# Patient Record
Sex: Female | Born: 1970 | Hispanic: No | Marital: Married | Smoking: Never smoker
Health system: Southern US, Community
[De-identification: ages and names within clinical notes are randomized; demographics above are authoritative.]

## PROBLEM LIST (undated history)

## (undated) DIAGNOSIS — I10 Essential (primary) hypertension: Secondary | ICD-10-CM

---

## 2004-11-17 ENCOUNTER — Other Ambulatory Visit: Admission: RE | Admit: 2004-11-17 | Discharge: 2004-11-17 | Payer: Self-pay | Admitting: Family Medicine

## 2011-07-20 ENCOUNTER — Other Ambulatory Visit (HOSPITAL_COMMUNITY): Payer: Self-pay | Admitting: Family Medicine

## 2011-07-20 DIAGNOSIS — Z139 Encounter for screening, unspecified: Secondary | ICD-10-CM

## 2011-07-25 ENCOUNTER — Encounter (HOSPITAL_COMMUNITY): Payer: Self-pay

## 2011-08-15 ENCOUNTER — Ambulatory Visit (HOSPITAL_COMMUNITY)
Admission: RE | Admit: 2011-08-15 | Discharge: 2011-08-15 | Disposition: A | Discharge status: Home / Self Care | Payer: Self-pay | Source: Ambulatory Visit | Attending: Family Medicine | Admitting: Family Medicine

## 2011-08-15 DIAGNOSIS — Z139 Encounter for screening, unspecified: Secondary | ICD-10-CM

## 2012-09-11 ENCOUNTER — Telehealth: Payer: Self-pay | Admitting: Nurse Practitioner

## 2012-09-11 NOTE — Telephone Encounter (Signed)
NOTIFIED NOT TAKING NEW PATIENTS. WILL NEED TO GO TO ER OR EMERGENCY ROOM.

## 2012-10-11 ENCOUNTER — Other Ambulatory Visit (HOSPITAL_COMMUNITY): Payer: Self-pay | Admitting: *Deleted

## 2012-10-11 DIAGNOSIS — Z139 Encounter for screening, unspecified: Secondary | ICD-10-CM

## 2012-10-15 ENCOUNTER — Ambulatory Visit (HOSPITAL_COMMUNITY)
Admission: RE | Admit: 2012-10-15 | Discharge: 2012-10-15 | Disposition: A | Discharge status: Home / Self Care | Payer: 59 | Source: Ambulatory Visit | Attending: *Deleted | Admitting: *Deleted

## 2012-10-15 DIAGNOSIS — Z1231 Encounter for screening mammogram for malignant neoplasm of breast: Secondary | ICD-10-CM | POA: Insufficient documentation

## 2012-10-15 DIAGNOSIS — Z139 Encounter for screening, unspecified: Secondary | ICD-10-CM

## 2013-06-25 ENCOUNTER — Ambulatory Visit (INDEPENDENT_AMBULATORY_CARE_PROVIDER_SITE_OTHER): Payer: 59 | Admitting: Family Medicine

## 2013-06-25 ENCOUNTER — Emergency Department (HOSPITAL_COMMUNITY)
Admission: EM | Admit: 2013-06-25 | Discharge: 2013-06-25 | Disposition: A | Discharge status: Home / Self Care | Payer: 59 | Attending: Emergency Medicine | Admitting: Emergency Medicine

## 2013-06-25 ENCOUNTER — Emergency Department (HOSPITAL_COMMUNITY): Payer: 59

## 2013-06-25 ENCOUNTER — Encounter (HOSPITAL_COMMUNITY): Payer: Self-pay | Admitting: Emergency Medicine

## 2013-06-25 ENCOUNTER — Encounter (INDEPENDENT_AMBULATORY_CARE_PROVIDER_SITE_OTHER): Payer: Self-pay

## 2013-06-25 VITALS — BP 138/84 | HR 99 | Temp 97.8°F | Wt 197.0 lb

## 2013-06-25 DIAGNOSIS — R197 Diarrhea, unspecified: Secondary | ICD-10-CM | POA: Insufficient documentation

## 2013-06-25 DIAGNOSIS — K429 Umbilical hernia without obstruction or gangrene: Secondary | ICD-10-CM | POA: Insufficient documentation

## 2013-06-25 DIAGNOSIS — I10 Essential (primary) hypertension: Secondary | ICD-10-CM | POA: Insufficient documentation

## 2013-06-25 DIAGNOSIS — G8929 Other chronic pain: Secondary | ICD-10-CM

## 2013-06-25 DIAGNOSIS — R059 Cough, unspecified: Secondary | ICD-10-CM | POA: Insufficient documentation

## 2013-06-25 DIAGNOSIS — M79609 Pain in unspecified limb: Secondary | ICD-10-CM | POA: Insufficient documentation

## 2013-06-25 DIAGNOSIS — R05 Cough: Secondary | ICD-10-CM | POA: Insufficient documentation

## 2013-06-25 DIAGNOSIS — R319 Hematuria, unspecified: Secondary | ICD-10-CM | POA: Insufficient documentation

## 2013-06-25 DIAGNOSIS — R1031 Right lower quadrant pain: Secondary | ICD-10-CM

## 2013-06-25 DIAGNOSIS — Z3202 Encounter for pregnancy test, result negative: Secondary | ICD-10-CM | POA: Insufficient documentation

## 2013-06-25 DIAGNOSIS — R079 Chest pain, unspecified: Secondary | ICD-10-CM | POA: Insufficient documentation

## 2013-06-25 DIAGNOSIS — R109 Unspecified abdominal pain: Secondary | ICD-10-CM | POA: Insufficient documentation

## 2013-06-25 DIAGNOSIS — R112 Nausea with vomiting, unspecified: Secondary | ICD-10-CM

## 2013-06-25 DIAGNOSIS — R0602 Shortness of breath: Secondary | ICD-10-CM | POA: Insufficient documentation

## 2013-06-25 HISTORY — DX: Essential (primary) hypertension: I10

## 2013-06-25 LAB — CBC WITH DIFFERENTIAL/PLATELET
Basophils Absolute: 0 10*3/uL (ref 0.0–0.1)
Basophils Relative: 0 % (ref 0–1)
Eosinophils Absolute: 0.2 10*3/uL (ref 0.0–0.7)
Eosinophils Relative: 2 % (ref 0–5)
HEMATOCRIT: 37.7 % (ref 36.0–46.0)
HEMOGLOBIN: 13.1 g/dL (ref 12.0–15.0)
Lymphocytes Relative: 17 % (ref 12–46)
Lymphs Abs: 2 10*3/uL (ref 0.7–4.0)
MCH: 30 pg (ref 26.0–34.0)
MCHC: 34.7 g/dL (ref 30.0–36.0)
MCV: 86.5 fL (ref 78.0–100.0)
MONO ABS: 0.9 10*3/uL (ref 0.1–1.0)
Monocytes Relative: 8 % (ref 3–12)
Neutro Abs: 8.5 10*3/uL — ABNORMAL HIGH (ref 1.7–7.7)
Neutrophils Relative %: 73 % (ref 43–77)
Platelets: 309 10*3/uL (ref 150–400)
RBC: 4.36 MIL/uL (ref 3.87–5.11)
RDW: 12.8 % (ref 11.5–15.5)
WBC: 11.7 10*3/uL — AB (ref 4.0–10.5)

## 2013-06-25 LAB — URINALYSIS, ROUTINE W REFLEX MICROSCOPIC
BILIRUBIN URINE: NEGATIVE
Glucose, UA: NEGATIVE mg/dL
KETONES UR: NEGATIVE mg/dL
Leukocytes, UA: NEGATIVE
Nitrite: NEGATIVE
Protein, ur: NEGATIVE mg/dL
Specific Gravity, Urine: 1.01 (ref 1.005–1.030)
UROBILINOGEN UA: 0.2 mg/dL (ref 0.0–1.0)
pH: 5.5 (ref 5.0–8.0)

## 2013-06-25 LAB — COMPREHENSIVE METABOLIC PANEL
ALK PHOS: 74 U/L (ref 39–117)
ALT: 18 U/L (ref 0–35)
AST: 13 U/L (ref 0–37)
Albumin: 3.1 g/dL — ABNORMAL LOW (ref 3.5–5.2)
BUN: 8 mg/dL (ref 6–23)
CO2: 21 meq/L (ref 19–32)
CREATININE: 0.49 mg/dL — AB (ref 0.50–1.10)
Calcium: 8 mg/dL — ABNORMAL LOW (ref 8.4–10.5)
Chloride: 104 mEq/L (ref 96–112)
GFR calc Af Amer: >90 mL/min (ref >90)
Glucose, Bld: 96 mg/dL (ref 70–99)
Potassium: 3.4 mEq/L — ABNORMAL LOW (ref 3.7–5.3)
SODIUM: 138 meq/L (ref 137–147)
Total Protein: 7.2 g/dL (ref 6.0–8.3)

## 2013-06-25 LAB — URINE MICROSCOPIC-ADD ON

## 2013-06-25 LAB — PREGNANCY, URINE: PREG TEST UR: NEGATIVE

## 2013-06-25 LAB — TROPONIN I: Troponin I: <0.3 ng/mL (ref <0.30)

## 2013-06-25 MED ORDER — SODIUM CHLORIDE 0.9 % IV BOLUS (SEPSIS)
500.0000 mL | Freq: Once | INTRAVENOUS | Status: AC
  Administered 2013-06-25: 500 mL via INTRAVENOUS

## 2013-06-25 MED ORDER — ONDANSETRON HCL 4 MG/2ML IJ SOLN
4.0000 mg | Freq: Once | INTRAMUSCULAR | Status: AC
  Administered 2013-06-25: 4 mg via INTRAVENOUS
  Filled 2013-06-25: qty 2

## 2013-06-25 MED ORDER — ONDANSETRON HCL 4 MG PO TABS: 4.0000 mg | ORAL_TABLET | Freq: Four times a day (QID) | ORAL | Status: DC

## 2013-06-25 MED ORDER — MORPHINE SULFATE 4 MG/ML IJ SOLN
4.0000 mg | Freq: Once | INTRAMUSCULAR | Status: AC
  Administered 2013-06-25: 4 mg via INTRAVENOUS
  Filled 2013-06-25: qty 1

## 2013-06-25 NOTE — ED Notes (Signed)
Return to room

## 2013-06-25 NOTE — ED Notes (Addendum)
Patient arrived via [facility] from Dr. Isidore Moosffice with left sided chest pain that is described as squeezing with radiating pain to her left arm. No nausea or diaphoresis noted by EMS. Language barrier. Dr. Isidore Moosffice administered asprin 324mg , NTG SL x1 pain went from 7/10 to 2/10.

## 2013-06-25 NOTE — ED Provider Notes (Signed)
CSN: [id number]     Arrival date & time 06/25/13  1205 History   First MD Initiated Contact with Patient 06/25/13 1211     Chief Complaint  Patient presents with  . Chest Pain   (Consider location/radiation/quality/duration/timing/severity/associated sxs/prior Treatment) Patient is a 43 y.o. female presenting with chest pain and abdominal pain. The history is provided by the patient and a relative. A language interpreter was used Garment/textile technologist(Interpreter is daughter at bedside.).  Chest Pain Pain location:  L chest Pain radiates to:  L arm Associated symptoms: abdominal pain, cough, nausea, shortness of breath and vomiting   Associated symptoms: no fever   Associated symptoms comment:  She reports left sided chest pain constant for the past 2 days, intermittent for the past one month. She has had similar pain in the past without firm diagnosis of cause. No fever. She has a mild cough that worsens the discomfort. The discomfort radiates into the left arm. She has some shortness of breath that persists after the pain subsides. She went to her primary care doctor's office prior to arrival and was given ASA and one NTG with reduction in pain. No history of cardiac events, however, she does report MI history in her mother and sister.  Abdominal Pain Pain location:  RUQ and RLQ Pain quality: aching   Associated symptoms: chest pain, cough, diarrhea, hematuria, nausea, shortness of breath and vomiting   Associated symptoms: no dysuria and no fever   Associated symptoms comment:  She also has right sided abdominal pain that is intermittent. It radiates from the lateral abdominal wall to the RLQ and has been going on for the past 2-3 days. She reports intermittent hematuria without dysuria or frequency. She has had nausea, vomiting and diarrhea for the past 2-3 days as well, and states her daughter had similar symptoms (N, V, D) last week. She denies melena or bloody stool.    Past Medical History  Diagnosis Date   . Hypertension    Past Surgical History  Procedure Laterality Date  . Cesarean section     No family history on file. History  Substance Use Topics  . Smoking status: Never Smoker   . Smokeless tobacco: Never Used  . Alcohol Use: No   OB History   Grav Para Term Preterm Abortions TAB SAB Ect Mult Living                 Review of Systems  Constitutional: Negative for fever.  HENT: Negative.   Respiratory: Positive for cough and shortness of breath.   Cardiovascular: Positive for chest pain.  Gastrointestinal: Positive for nausea, vomiting, abdominal pain and diarrhea.  Genitourinary: Positive for hematuria. Negative for dysuria and frequency.  Musculoskeletal: Negative.   Skin: Negative for color change.  Neurological: Negative.     Allergies  Review of patient's allergies indicates no known allergies.  Home Medications   Current Outpatient Rx  Name  Route  Sig  Dispense  Refill  . acetaminophen (TYLENOL) 500 MG tablet   Oral   Take 500 mg by mouth every 6 (six) hours as needed for fever or headache.         . bismuth subsalicylate (PEPTO BISMOL) 262 MG/15ML suspension   Oral   Take 15 mLs by mouth every 6 (six) hours as needed.          BP 102/77  Pulse 87  Temp(Src) 98.2 F (36.8 C) (Oral)  Resp 19  SpO2 98% Physical Exam  Constitutional: She  is oriented to person, place, and time. She appears well-developed and well-nourished. No distress.  HENT:  Head: Normocephalic.  Neck: Normal range of motion. Neck supple.  Cardiovascular: Normal rate and regular rhythm.   Pulmonary/Chest: Effort normal and breath sounds normal.  Abdominal: Soft. Bowel sounds are normal. There is tenderness. There is no rebound and no guarding.  Small umbilical hernia present that is soft and easily reducible. Mildly tender. Greater tenderness to later right mid-abdomen, radiating into RLQ. No rebound. Minimal guarding.   Musculoskeletal: Normal range of motion.   Neurological: She is alert and oriented to person, place, and time.  Skin: Skin is warm and dry. No rash noted. She is not diaphoretic.  Psychiatric: She has a normal mood and affect.    ED Course  Procedures (including critical care time) Labs Review  Results for orders placed during the hospital encounter of 06/25/13  TROPONIN I      Result Value Range   Troponin I <0.30  <0.30 ng/mL  CBC WITH DIFFERENTIAL      Result Value Range   WBC 11.7 (*) 4.0 - 10.5 K/uL   RBC 4.36  3.87 - 5.11 MIL/uL   Hemoglobin 13.1  12.0 - 15.0 g/dL   HCT 14.7  82.9 - 56.2 %   MCV 86.5  78.0 - 100.0 fL   MCH 30.0  26.0 - 34.0 pg   MCHC 34.7  30.0 - 36.0 g/dL   RDW 13.0  86.5 - 78.4 %   Platelets 309  150 - 400 K/uL   Neutrophils Relative % 73  43 - 77 %   Neutro Abs 8.5 (*) 1.7 - 7.7 K/uL   Lymphocytes Relative 17  12 - 46 %   Lymphs Abs 2.0  0.7 - 4.0 K/uL   Monocytes Relative 8  3 - 12 %   Monocytes Absolute 0.9  0.1 - 1.0 K/uL   Eosinophils Relative 2  0 - 5 %   Eosinophils Absolute 0.2  0.0 - 0.7 K/uL   Basophils Relative 0  0 - 1 %   Basophils Absolute 0.0  0.0 - 0.1 K/uL  COMPREHENSIVE METABOLIC PANEL      Result Value Range   Sodium 138  137 - 147 mEq/L   Potassium 3.4 (*) 3.7 - 5.3 mEq/L   Chloride 104  96 - 112 mEq/L   CO2 21  19 - 32 mEq/L   Glucose, Bld 96  70 - 99 mg/dL   BUN 8  6 - 23 mg/dL   Creatinine, Ser 6.96 (*) 0.50 - 1.10 mg/dL   Calcium 8.0 (*) 8.4 - 10.5 mg/dL   Total Protein 7.2  6.0 - 8.3 g/dL   Albumin 3.1 (*) 3.5 - 5.2 g/dL   AST 13  0 - 37 U/L   ALT 18  0 - 35 U/L   Alkaline Phosphatase 74  39 - 117 U/L   Total Bilirubin <0.2 (*) 0.3 - 1.2 mg/dL   GFR calc non Af Amer >90  >90 mL/min   GFR calc Af Amer >90  >90 mL/min  URINALYSIS, ROUTINE W REFLEX MICROSCOPIC      Result Value Range   Color, Urine YELLOW  YELLOW   APPearance CLEAR  CLEAR   Specific Gravity, Urine 1.010  1.005 - 1.030   pH 5.5  5.0 - 8.0   Glucose, UA NEGATIVE  NEGATIVE mg/dL   Hgb  urine dipstick TRACE (*) NEGATIVE   Bilirubin Urine NEGATIVE  NEGATIVE  Ketones, ur NEGATIVE  NEGATIVE mg/dL   Protein, ur NEGATIVE  NEGATIVE mg/dL   Urobilinogen, UA 0.2  0.0 - 1.0 mg/dL   Nitrite NEGATIVE  NEGATIVE   Leukocytes, UA NEGATIVE  NEGATIVE  URINE MICROSCOPIC-ADD ON      Result Value Range   Squamous Epithelial / LPF FEW (*) RARE   WBC, UA 0-2  <3 WBC/hpf   RBC / HPF 0-2  <3 RBC/hpf  PREGNANCY, URINE      Result Value Range   Preg Test, Ur NEGATIVE  NEGATIVE    Imaging Review Dg Chest 2 View  06/25/2013   CLINICAL DATA:  Chest pain and shortness of breath  EXAM: CHEST  2 VIEW  COMPARISON:  None.  FINDINGS: Lungs are clear. Heart size and pulmonary vascularity are normal. No pneumothorax. No adenopathy. No bone lesions.  IMPRESSION: No abnormality noted.   Electronically Signed   By: Bretta Bang M.D.   On: 06/25/2013 13:32  Ct Abdomen Pelvis Wo Contrast  06/25/2013   CLINICAL DATA:  Right-sided flank pain and hematuria  EXAM: CT ABDOMEN AND PELVIS WITHOUT CONTRAST  TECHNIQUE: Multidetector CT imaging of the abdomen and pelvis was performed following the standard protocol without intravenous contrast.  COMPARISON:  None.  FINDINGS: Lung bases are free of acute infiltrate or sizable effusion. The liver, gallbladder, spleen, adrenal glands and pancreas are normal in their CT appearance. The kidneys are well visualized bilaterally and reveal no renal calculi or urinary tract obstructive changes.  A periumbilical hernia is noted containing omental fat. No incarcerated bowel loops are seen. The appendix is well visualized and within normal limits. The bladder is partially distended. No pelvic mass lesion is seen. The osseous structures are grossly unremarkable.  IMPRESSION: Periumbilical fat containing hernia.  No obstructive changes are seen.  No renal calculi are noted.   Electronically Signed   By: Alcide Clever M.D.   On: 06/25/2013 17:13   EKG Interpretation     Date/Time:  Tuesday June 25 2013 12:22:32 EST Ventricular Rate:  95 PR Interval:  126 QRS Duration: 99 QT Interval:  383 QTC Calculation: 481 R Axis:   25 Text Interpretation:  Sinus rhythm Nonspecific ST and T wave abnormality No previous tracing Confirmed by POLLINA  MD, CHRISTOPHER ([id number]) on 06/25/2013 12:38:14 PM            MDM  No diagnosis found. 1. Abdominal pain 2. Nausea, vomiting and diarrhea 3. Chest pain  Pain in chest atypical for ACS, labs/ekg reassuring. N, V, D without significant dehydration, similar to family member and not active in ED. Tolerating PO fluids. Feeling improved from this standpoint. She has flank and lateral abdominal pain in setting of N, V, D - appendicitis and kidney stone considered - ruled out with non-contrasted CT scan. She is stable and feeling improved. Ok to discharge home with return precautions.     Arnoldo Hooker, PA-C 06/29/13 1507

## 2013-06-25 NOTE — Patient Instructions (Signed)
Chest Pain (Nonspecific) °It is often hard to give a specific diagnosis for the cause of chest pain. There is always a chance that your pain could be related to something serious, such as a heart attack or a blood clot in the lungs. You need to follow up with your caregiver for further evaluation. °CAUSES  °· Heartburn. °· Pneumonia or bronchitis. °· Anxiety or stress. °· Inflammation around your heart (pericarditis) or lung (pleuritis or pleurisy). °· A blood clot in the lung. °· A collapsed lung (pneumothorax). It can develop suddenly on its own (spontaneous pneumothorax) or from injury (trauma) to the chest. °· Shingles infection (herpes zoster virus). °The chest wall is composed of bones, muscles, and cartilage. Any of these can be the source of the pain. °· The bones can be bruised by injury. °· The muscles or cartilage can be strained by coughing or overwork. °· The cartilage can be affected by inflammation and become sore (costochondritis). °DIAGNOSIS  °Lab tests or other studies, such as X-rays, electrocardiography, stress testing, or cardiac imaging, may be needed to find the cause of your pain.  °TREATMENT  °· Treatment depends on what may be causing your chest pain. Treatment may include: °· Acid blockers for heartburn. °· Anti-inflammatory medicine. °· Pain medicine for inflammatory conditions. °· Antibiotics if an infection is present. °· You may be advised to change lifestyle habits. This includes stopping smoking and avoiding alcohol, caffeine, and chocolate. °· You may be advised to keep your head raised (elevated) when sleeping. This reduces the chance of acid going backward from your stomach into your esophagus. °· Most of the time, nonspecific chest pain will improve within 2 to 3 days with rest and mild pain medicine. °HOME CARE INSTRUCTIONS  °· If antibiotics were prescribed, take your antibiotics as directed. Finish them even if you start to feel better. °· For the next few days, avoid physical  activities that bring on chest pain. Continue physical activities as directed. °· Do not smoke. °· Avoid drinking alcohol. °· Only take over-the-counter or prescription medicine for pain, discomfort, or fever as directed by your caregiver. °· Follow your caregiver's suggestions for further testing if your chest pain does not go away. °· Keep any follow-up appointments you made. If you do not go to an appointment, you could develop lasting (chronic) problems with pain. If there is any problem keeping an appointment, you must call to reschedule. °SEEK MEDICAL CARE IF:  °· You think you are having problems from the medicine you are taking. Read your medicine instructions carefully. °· Your chest pain does not go away, even after treatment. °· You develop a rash with blisters on your chest. °SEEK IMMEDIATE MEDICAL CARE IF:  °· You have increased chest pain or pain that spreads to your arm, neck, jaw, back, or abdomen. °· You develop shortness of breath, an increasing cough, or you are coughing up blood. °· You have severe back or abdominal pain, feel nauseous, or vomit. °· You develop severe weakness, fainting, or chills. °· You have a fever. °THIS IS AN EMERGENCY. Do not wait to see if the pain will go away. Get medical help at once. Call your local emergency services (911 in [place] Do not drive yourself to the hospital. °MAKE SURE YOU:  °· Understand these instructions. °· Will watch your condition. °· Will get help right away if you are not doing well or get worse. °Document Released: 03/09/2005 Document Revised: 08/22/2011 Document Reviewed: 01/03/2008 °ExitCare® Patient Information ©2014 [employer]   LL[employer]. ° °

## 2013-06-25 NOTE — ED Notes (Signed)
Pt stated left mid chest pain since Sunday, stated nausea, vomiting and loose stool. Stated SOB with each chest pain and will not resolved after chest gone away.

## 2013-06-25 NOTE — Progress Notes (Signed)
   Subjective:    Patient ID: Donna Daniel, female    DOB: 01/29/1971, 43 y.o.   MRN: [id number]  HPI This 43 y.o. female presents for evaluation of chest pain and shortness of breath for 2 days. She has her daughter with her who interprets and she has N/V/D x 2 days with some abdominal Pain.  She also has associated substernal chest pain radiating down her left arm that has been Mild to severe during prior and during this period of GI disturbance x 2 days.  She states right Now she has some mild substernal chest pain.  She does not have radiating left shoulder or arm Discomfort but does get this when the chest pain becomes severe.  She has family hx of CAD (mother) who developed CAD in her 340's.  She has hx of hypertension and takes HCTZ.   Review of Systems C/o N/V/D, chest pain, and SOB. No  HA, dizziness, vision change, constipation, dysuria, urinary urgency or frequency, myalgias, arthralgias or rash.     Objective:   Physical Exam  Vital signs noted  Hispanic female in mild distress.  HEENT - Head atraumatic Normocephalic                Eyes - PERRLA, Conjuctiva - clear Sclera- Clear EOMI                Ears - EAC's Wnl TM's Wnl Gross Hearing WNL Respiratory - Lungs CTA bilateral  Cardiac - RRR S1 and S2 without murmur GI - Abdomen tender right lower quadrant     EKG - NSR with q wave and flipped ST in lead 3 Assessment & Plan:  Chest pain - Plan: EKG 12-Lead, NTG 0.4mg  SL, ASA 325mg  po now,  Discussed need to go to ED via 911 and patient agrees.   Discussed with hx alone she needs to be in The ED for r/o MI.  Nausea with vomiting - Follow up in ED  Abdominal pain, chronic, right lower quadrant - Follow up in ED.  Deatra CanterWilliam J Oxford FNP

## 2013-06-25 NOTE — Discharge Instructions (Signed)
Diarrea  °(Diarrhea) °La diarrea consiste en evacuaciones intestinales frecuentes, blandas o acuosas. Puede hacerlo sentir débil y deshidratado. La deshidratación puede hacer que se sienta cansado, sediento, tener la boca seca y que haya disminución de orina, que a menudo es de color amarillo oscuro. La diarrea es un signo de otro problema, generalmente una infección que no durará mucho tiempo. En la mayoría de los casos, la diarrea dura típicamente 2 a 3 días. Sin embargo, puede durar más tiempo si se trata de un signo de algo más serio. Es importante tratar la diarrea como lo indique su médico para disminuir o prevenir futuros episodios de diarrea.  °CAUSAS  °Algunas causas comunes son:  °· Infecciones gastrointestinales causadas por virus, bacterias o parásitos. °· Intoxicación alimentaria o alergias a los alimentos. °· Ciertos medicamentos, como los antibióticos, quimioterapia y laxantes. °· Edulcorantes artificiales y fructosa. °· Los trastornos digestivos. °INSTRUCCIONES PARA EL CUIDADO EN EL HOGAR  °· Asegure una adecuada ingesta de líquidos (hidratación). Evite los líquidos que contengan azúcares simples o las bebidas deportivas, los jugos de frutas, los productos derivados de la leche entera y las gaseosas. Si bebe la cantidad suficiente de líquidos, la orina debe ser clara o amarillo pálido. Una solución de rehidratación oral se puede comprar en las farmacias, en las tiendas minoristas y por Internet. Se puede preparar una solución de rehidratación oral casera con los siguientes ingredientes: °·    cucharadita de sal. °· ¾ cucharadita de bicarbonato. °·  de cucharadita de sal sustituta que contenga cloruro de potasio. °· 1  cucharada de azúcar. °· 1l (34 onzas) de agua. °· Ciertos alimentos y bebidas pueden aumentar la velocidad a la que el alimento se mueve a través del tracto gastrointestinal (GI). Estos alimentos y bebidas deben evitarse e incluyen: °· Bebidas alcohólicas y con cafeína. °· Alimentos  ricos en fibra, como frutas y verduras, nueces, semillas, panes y cereales integrales. °· Alimentos y bebidas endulzados con alcoholes de azúcar, tales como xilitol, sorbitol, y manitol. °· Algunos alimentos pueden ser bien tolerados y puede ayudar a espesar las heces, incluyendo: °· Alimentos con almidón, como arroz, pan, pasta, cereales bajos en azúcar, avena, sémola de maíz, papas al horno, galletas y panecillos. °· Bananas. °· Puré de manzana. °· Agregue alimentos ricos en probióticos a la dieta del niño para ayudar a aumentar las bacterias saludables en el tracto gastrointestinal, como el yogur y productos lácteos fermentados. °· Lávese bien las manos después de cada episodio de diarrea. °· Tome sólo medicamentos de venta libre o recetados, según las indicaciones del médico. °· Tome un baño caliente para ayudar a disminuir ardor o dolor por los episodios frecuentes de diarrea. °SOLICITE ATENCIÓN MÉDICA DE INMEDIATO SI:  °· No puede retener los líquidos. °· Tiene vómitos persistentes. °· Observa sangre en la materia fecal, o las heces son negras y de aspecto alquitranado. °· No hay emisión de orina durante 6 a 8 horas o elimina una pequeña cantidad de orina muy oscura. °· Tiene dolor abdominal que aumenta o se localiza. °· Está muy mareado o se desvanece. °· Sufre un dolor intenso de cabeza. °· La diarrea empeora o no mejora. °· Tiene fiebre o síntomas que persisten durante más de 2 o 3 días. °· Tiene fiebre y los síntomas empeoran de manera súbita. °ASEGÚRESE DE QUE:  °· Comprende estas instrucciones. °· Controlará su enfermedad. °· Solicitará ayuda de inmediato si no mejora o si empeora. °Document Released: 05/30/2005 Document Revised: 05/16/2012 °ExitCare® Patient Information ©2014 [employer]. °  Náuseas y Vómitos °(Nausea and Vomiting) °La náusea es la sensación de malestar en el estómago o de la necesidad de vomitar. El vómito es un reflejo por el que los contenidos del estómago salen por la boca. El  vómito puede ocasionar pérdida de líquidos del organismo (deshidratación). Los niños y los adultos mayores pueden deshidratarse rápidamente (en especial si también tienen diarrea). Las náuseas y los vómitos son síntoma de un trastorno o enfermedad. Es importante averiguar la causa de los síntomas. °CAUSAS °· Irritación directa de la membrana que cubre el estómago. Esta irritación puede ser resultado del aumento de la producción de ácido, (reflujo gastroesofágico), infecciones, intoxicación alimentaria, ciertos medicamentos (como antinflamatorios no esteroideos), consumo de alcohol o de tabaco. °· Señales del cerebro. Estas señales pueden ser un dolor de cabeza, exposición al calor, trastornos del oído interno, aumento de la presión en el cerebro por lesiones, infección, un tumor o conmoción cerebral, estímulos emocionales o problemas metabólicos. °· Una obstrucción en el tracto gastrointestinal (obstrucción intestinal). °· Ciertas enfermedades como la diabetes, problemas en la vesícula biliar, apendicitis, problemas renales, cáncer, sepsis, síntomas atípicos de infarto o trastornos alimentarios. °· Tratamientos médicos como la quimioterapia y la radiación. °· Medicamentos que inducen al sueño (anestesia general) durante una cirugía. °DIAGNÓSTICO  °El médico podrá solicitarle algunos análisis si los problemas no mejoran luego de algunos días. También podrán pedirle análisis si los síntomas son graves o si el motivo de los vómitos o las náuseas no está claro. Los análisis pueden ser:  °· Análisis de orina. °· Análisis de sangre. °· Pruebas de materia fecal. °· Cultivos (para buscar evidencias de infección). °· Radiografías u otros estudios por imágenes. °Los resultados de las pruebas lo ayudarán al médico a tomar decisiones acerca del mejor curso de tratamiento o la necesidad de análisis adicionales.  °TRATAMIENTO  °Debe estar bien hidratado. Beba con frecuencia pequeñas cantidades de líquido. Puede beber agua,  bebidas deportivas, caldos claros o comer pequeños trocitos de hielo o gelatina para mantenerse hidratado. Cuando coma, hágalo lentamente para evitar las náuseas. Hay medicamentos para evitar las náuseas que pueden aliviarlo.  °INSTRUCCIONES PARA EL CUIDADO DOMICILIARIO °· Si su médico le prescribe medicamentos tómelos como se le haya indicado. °· Si no tiene hambre, no se fuerce a comer. Sin embargo, es necesario que tome líquidos. °· Si tiene hambre aliméntese con una dieta normal, a menos que el médico le indique otra cosa. °· Los mejores alimentos son una combinación de carbohidratos complejos (arroz, trigo, papas, pan), carnes magras, yogur, frutas y vegetales. °· Evite los alimentos ricos en grasas porque dificultan la digestión. °· Beba gran cantidad de líquido para mantener la orina de tono claro o color amarillo pálido. °· Si está deshidratado, consulte a su médico para que le dé instrucciones específicas para volver a hidratarlo. Los signos de deshidratación son: °· Mucha sed. °· Labios y boca secos. °· Mareos. °· Orina oscura. °· Disminución de la frecuencia y cantidad de la orina. °· Confusión. °· Tiene el pulso o la respiración acelerados. °SOLICITE ATENCIÓN MÉDICA DE INMEDIATO SI: °· Vomita sangre o algo similar a la borra del café. °· La materia fecal (heces) es negra o tiene sangre. °· Sufre una cefalea grave o rigidez en el cuello. °· Se siente confundido. °· Siente dolor abdominal intenso. °· Tiene dolor en el pecho o dificultad para respirar. °· No orina por 8 horas. °· Tiene la piel fría y pegajosa. °· Sigue vomitando durante más de 24 a 48 horas. °· Tiene fiebre. °ASEGÚRESE QUE:  °·   Comprende estas instrucciones. °· Controlará su enfermedad. °· Solicitará ayuda inmediatamente si no mejora o si empeora. °Document Released: 06/19/2007 Document Revised: 08/22/2011 °ExitCare® Patient Information ©2014 [employer]. ° °

## 2013-06-25 NOTE — ED Notes (Signed)
Patient transported to X-ray 

## 2013-07-01 NOTE — ED Provider Notes (Signed)
Medical screening examination/treatment/procedure(s) were performed by non-physician practitioner and as supervising physician I was immediately available for consultation/collaboration.  EKG Interpretation    Date/Time:  Tuesday June 25 2013 12:22:32 EST Ventricular Rate:  95 PR Interval:  126 QRS Duration: 99 QT Interval:  383 QTC Calculation: 481 R Axis:   25 Text Interpretation:  Sinus rhythm Nonspecific ST and T wave abnormality No previous tracing Confirmed by Blinda LeatherwoodPOLLINA  MD, CHRISTOPHER ([id number]) on 06/25/2013 12:38:14 PM              Gilda Creasehristopher J. Pollina, MD 07/01/13 [phone]

## 2015-03-19 ENCOUNTER — Ambulatory Visit (INDEPENDENT_AMBULATORY_CARE_PROVIDER_SITE_OTHER): Payer: Managed Care, Other (non HMO) | Admitting: *Deleted

## 2015-03-19 DIAGNOSIS — Z23 Encounter for immunization: Secondary | ICD-10-CM | POA: Diagnosis not present

## 2016-03-15 ENCOUNTER — Ambulatory Visit (INDEPENDENT_AMBULATORY_CARE_PROVIDER_SITE_OTHER): Payer: Managed Care, Other (non HMO)

## 2016-03-15 DIAGNOSIS — Z23 Encounter for immunization: Secondary | ICD-10-CM | POA: Diagnosis not present

## 2016-03-22 ENCOUNTER — Ambulatory Visit (INDEPENDENT_AMBULATORY_CARE_PROVIDER_SITE_OTHER): Payer: Worker's Compensation

## 2016-03-22 ENCOUNTER — Ambulatory Visit (INDEPENDENT_AMBULATORY_CARE_PROVIDER_SITE_OTHER): Payer: Worker's Compensation | Admitting: Family Medicine

## 2016-03-22 VITALS — BP 139/78 | HR 110 | Temp 98.2°F | Ht <= 58 in | Wt 200.0 lb

## 2016-03-22 DIAGNOSIS — M79671 Pain in right foot: Secondary | ICD-10-CM | POA: Diagnosis not present

## 2016-03-22 MED ORDER — TRAMADOL HCL 50 MG PO TABS: 50.0000 mg | ORAL_TABLET | Freq: Four times a day (QID) | ORAL | 0 refills | Status: AC | PRN

## 2016-03-22 MED ORDER — DICLOFENAC SODIUM 75 MG PO TBEC: 75.0000 mg | DELAYED_RELEASE_TABLET | Freq: Two times a day (BID) | ORAL | 0 refills | Status: AC

## 2016-03-22 MED ORDER — ANKLE STIRRUP BRACE/RIGHT MISC: 0 refills | Status: DC

## 2016-03-22 NOTE — Patient Instructions (Signed)
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 It is important to elevate and use ice packs for the first 48 hours. You can walk with crutches and a brace for short times. I recommend no standing for over 10 minutes per hour and no more than an hour total during today's time. This applies until return in 2 weeks.   Esguince de tobillo (Ankle Sprain)  Un esguince de tobillo es una lesin en los tejidos fuertes y fibrosos (ligamentos) que mantienen unidos los huesos de la articulacin del tobillo.  CAUSAS  Las causas pueden ser una cada o la torcedura del tobillo. Los esguinces de tobillo ocurren con ms frecuencia al pisar con el borde exterior del pie, lo que hace que el tobillo se vuelva hacia adentro. Las personas que practican deportes son ms propensas a este tipo de lesiones.  SNTOMAS   Dolor en el tobillo. El dolor puede aparecer durante el reposo o slo al tratar de ponerse de pie o caminar.  Hinchazn.  Hematomas. Los hematomas pueden aparecer inmediatamente o luego de 1 a 2 das despus de la lesin.  Dificultad para pararse o caminar, especialmente al doblar en esquinas o al cambiar de direccin. DIAGNSTICO  El mdico le preguntar detalles acerca de la lesin y le har un examen fsico del tobillo para determinar si tiene un esguince. Durante el examen fsico, el mdico apretar y Contractoraplicar presin en reas especficas del pie y del tobillo. El mdico tratar de Licensed conveyancermover el tobillo en ciertas direcciones. Le indicarn una radiografa para descartar la fractura de un hueso o que un ligamento no se haya separado de uno de los huesos del tobillo (fractura por avulsin).  TRATAMIENTO  Algunos tipos de soporte podrn ayudarlo a estabilizar el tobillo. El profesional que lo asiste le dar las indicaciones. Tambin podr indicarle que use medicamentos para Primary school teachercalmar el dolor. Si el esguince es grave, su mdico podr derivarlo a un cirujano que lo ayudar a  Psychologist, occupationalrecuperar la funcin de las partes afectadas del sistema esqueltico (ortopedista) o a un fisioterapeuta.  INSTRUCCIONES PARA EL CUIDADO EN EL HOGAR   Aplique hielo en la articulacin lesionada durante 1  2 das o segn lo que le indique su mdico. La aplicacin del hielo ayuda a reducir la inflamacin y Chief Technology Officerel dolor.  Ponga el hielo en una bolsa plstica.  Colquese una toalla entre la piel y la bolsa de hielo.  Deje el hielo en el lugar durante 15 a 20 minutos por vez, cada 2 horas mientras est despierto.  Slo tome medicamentos de venta libre o recetados para Primary school teachercalmar el dolor, las molestias o bajar la fiebre segn las indicaciones de su mdico.  Eleve el tobillo lesionado por encima del nivel del corazn tanto como pueda durante 2 o 3 das.  Si su mdico le indica el uso de [place], selas segn las instrucciones. Gradualmente lleve el peso sobre el tobillo Chaffeeafectado. Siga usando muletas o un bastn hasta que pueda caminar sin sentir dolor en el tobillo.  Si tiene una frula de yeso, sela como lo indique su mdico. No se apoye en ninguna cosa ms dura que una Eaton Corporationalmohada durante las primeras 24 horas. No ponga peso sobre la frula. No permita que se moje. Puede quitrsela para tomar una ducha o un bao.  Pueden haberle colocado un vendaje elstico para usar alrededor del tobillo para darle soporte. Si el vendaje elstico est muy ajustado (siente adormecimiento u hormigueo o el pie  est fro y azul), ajstelo para que sea ms cmodo.  Si usted tiene una frula de [place], puede soplar o dejar salir el aire para que sea ms cmodo. Puede quitarse la frula por la noche y antes de tomar una ducha o un bao. Mueva los dedos de los pies en la frula varias veces al da para disminuir la hinchazn. SOLICITE ATENCIN MDICA SI:   Le aumenta rpidamente el moretn o el hinchazn.  Los dedos de los pies estn extremadamente fros o pierde la sensibilidad en el pie.  El dolor no se alivia con los  [employer]. SOLICITE ATENCIN MDICA DE INMEDIATO SI:   Los dedos de los pies estn adormecidos o de [employer].  Tiene un dolor agudo que va aumentando. ASEGRESE DE QUE:   Comprende estas instrucciones.  Controlar su enfermedad.  Solicitar ayuda de inmediato si no mejora o empeora.   Esta informacin no tiene Theme park manager el consejo del mdico. Asegrese de hacerle al mdico cualquier pregunta que tenga.   Document Released: 05/30/2005 Document Revised: 02/22/2012 Elsevier Interactive Patient Education [employer]

## 2016-03-22 NOTE — Progress Notes (Signed)
   Subjective:  Patient ID: Nunzio CoryMartha Burress, female    DOB: 06/13/1970  Age: 45 y.o. MRN: [id number]  CC: Foot Pain (WC, [employer] per Bjorn Loserhonda, R foot pain, twisted R ankle on board)   HPI Nunzio CoryMartha Meritt presents for Slipped on a board and stumbled sideways. Now having pain at the lateral malleolus region of the right ankle. She is able to walk, but it is painful to bear weight. It occurred approximately 2 hours ago. Pain is moderately intense. She has been applying ice. Swelling has occurred.    ROS Review of Systems  Constitutional: Negative.   Respiratory: Negative.   Cardiovascular: Negative.   Gastrointestinal: Negative.   Musculoskeletal: Positive for arthralgias, joint swelling and myalgias.  Neurological: Negative.     Objective:  BP 139/78 (BP Location: Left Arm, Patient Position: Sitting, Cuff Size: Large)   Pulse (!) 110   Temp 98.2 F (36.8 C) (Oral)   Ht 4' 9.5" (1.461 m)   Wt 200 lb (90.7 kg)   LMP 03/08/2016   BMI 42.53 kg/m   Physical Exam  Constitutional: She appears well-developed and well-nourished.  HENT:  Head: Normocephalic.  Cardiovascular: Normal rate and regular rhythm.   No murmur heard. Pulmonary/Chest: Effort normal and breath sounds normal.  Musculoskeletal: She exhibits edema and tenderness (at the right  lateral malleolus).    Assessment & Plan:   Johnny BridgeMartha was seen today for foot pain.  Diagnoses and all orders for this visit:  Right foot pain -     DG Foot Complete Right -     For home use only DME Crutches  Other orders -     Elastic Bandages & Supports (ANKLE STIRRUP BRACE/RIGHT) MISC; Wear at all times except when leg is elevated and iced. -     traMADol (ULTRAM) 50 MG tablet; Take 1 tablet (50 mg total) by mouth 4 (four) times daily as needed for moderate pain. -     diclofenac (VOLTAREN) 75 MG EC tablet; Take 1 tablet (75 mg total) by mouth 2 (two) times daily. For muscle and  Joint pain   I have discontinued Ms.  Trantham's acetaminophen, bismuth subsalicylate, and ondansetron. I am also having her start on Ankle Stirrup Brace/Right, traMADol, and diclofenac. Additionally, I am having her maintain her hydrochlorothiazide and albuterol.  Meds ordered this encounter  Medications  . hydrochlorothiazide (HYDRODIURIL) 25 MG tablet    Sig: Take 25 mg by mouth daily.  Marland Kitchen. albuterol (PROVENTIL HFA;VENTOLIN HFA) 108 (90 Base) MCG/ACT inhaler    Sig: Inhale 1-2 puffs into the lungs every 6 (six) hours as needed for wheezing or shortness of breath.  Clinical research associate. Elastic Bandages & Supports (ANKLE STIRRUP BRACE/RIGHT) MISC    Sig: Wear at all times except when leg is elevated and iced.    Dispense:  1 each    Refill:  0  . traMADol (ULTRAM) 50 MG tablet    Sig: Take 1 tablet (50 mg total) by mouth 4 (four) times daily as needed for moderate pain.    Dispense:  12 tablet    Refill:  0  . diclofenac (VOLTAREN) 75 MG EC tablet    Sig: Take 1 tablet (75 mg total) by mouth 2 (two) times daily. For muscle and  Joint pain    Dispense:  60 tablet    Refill:  0     Follow-up: Return in about 2 weeks (around 04/05/2016).  Mechele ClaudeWarren Nichoals Heyde, M.D.

## 2016-03-28 ENCOUNTER — Encounter: Payer: Self-pay | Admitting: Family Medicine

## 2016-04-05 ENCOUNTER — Ambulatory Visit (INDEPENDENT_AMBULATORY_CARE_PROVIDER_SITE_OTHER): Payer: Worker's Compensation | Admitting: Family Medicine

## 2016-04-05 ENCOUNTER — Encounter: Payer: Self-pay | Admitting: Family Medicine

## 2016-04-05 VITALS — BP 112/69 | HR 88 | Temp 97.9°F | Ht <= 58 in | Wt 203.5 lb

## 2016-04-05 DIAGNOSIS — S93401D Sprain of unspecified ligament of right ankle, subsequent encounter: Secondary | ICD-10-CM

## 2016-04-05 NOTE — Patient Instructions (Signed)
For the next week, you can perform her regular job on your feet for the first half of your shift. 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  While you are doing her regular job you should continue to wear your ankle brace. You can also wear it as needed for comfort at other times. However, I would like for you to try to wean off of it.  A week from now you can go to your regular job all day. I would like to see you back in 2-3 days after he resume your regular duties to make sure healing is continuing.

## 2016-04-05 NOTE — Progress Notes (Signed)
   Subjective:  Patient ID: Donna Daniel, female    DOB: 06/18/1970  Age: 45 y.o. MRN: [id number]  CC: Workers Comp follow up (pt here today following up on right foot which she states is doing much better)   HPI Donna Daniel presents for follow up of a foot injury. Slipped on a board and stumbled sideways 2 weeks ago. Now having much less pain at the lateral malleolus region of the right ankle. She is able to walk, some pain at midfoot. Wearing a brace and sitting for work.  Pain is minimal.    ROS Review of Systems  Constitutional: Negative.   Respiratory: Negative.   Cardiovascular: Negative.   Gastrointestinal: Negative.   Musculoskeletal: Positive for arthralgias.  Neurological: Negative.     Objective:  BP 112/69   Pulse 88   Temp 97.9 F (36.6 C) (Oral)   Ht 4' 9.5" (1.461 m)   Wt 203 lb 8 oz (92.3 kg)   LMP 03/08/2016   BMI 43.27 kg/m   Physical Exam  Constitutional: She appears well-developed and well-nourished.  HENT:  Head: Normocephalic.  Cardiovascular: Normal rate and regular rhythm.   No murmur heard. Pulmonary/Chest: Effort normal and breath sounds normal.  Musculoskeletal: She exhibits edema (minimal) and tenderness (at the right  lateral malleolus).    Assessment & Plan:   Donna Daniel was seen today for workers comp follow up.  Diagnoses and all orders for this visit:  Sprain of right ankle, unspecified ligament, subsequent encounter   I am having Donna Daniel maintain her hydrochlorothiazide, albuterol, Ankle Stirrup Brace/Right, traMADol, and diclofenac.  No orders of the defined types were placed in this encounter.   For the next week, you can perform her regular job on your feet for the first half of your shift. 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  While you are doing her regular job you should continue to wear your ankle brace. You can also wear it as needed for comfort  at other times. However, I would like for you to try to wean off of it.  A week from now you can go to your regular job all day. I would like to see you back in 2-3 days after he resume your regular duties to make sure healing is continuing. Follow-up: Return in about 10 days (around 04/15/2016) for ankle/ foot.  Mechele ClaudeWarren Hendryx Ricke, M.D.

## 2016-04-19 ENCOUNTER — Encounter: Payer: Self-pay | Admitting: Family Medicine

## 2016-04-19 ENCOUNTER — Ambulatory Visit (INDEPENDENT_AMBULATORY_CARE_PROVIDER_SITE_OTHER): Payer: Worker's Compensation | Admitting: Family Medicine

## 2016-04-19 VITALS — BP 135/77 | HR 101 | Temp 98.4°F | Ht <= 58 in | Wt 205.0 lb

## 2016-04-19 DIAGNOSIS — S93401D Sprain of unspecified ligament of right ankle, subsequent encounter: Secondary | ICD-10-CM | POA: Diagnosis not present

## 2016-05-08 NOTE — Progress Notes (Signed)
   Subjective:  Patient ID: Donna Daniel, female    DOB: 08/06/1970  Age: 45 y.o. MRN: [id number]  CC: work comp follow up (right ankle )   HPI Donna CoryMartha Daniel presents for follow up of a foot injury. Slipped on a board and stumbled sideways 2 weeks ago. Now having much less pain at the lateral malleolus region of the right ankle. She is able to walk pain free. She is no longer wearing a brace or sitting for work.  Pain is minimal.    ROS Review of Systems  Constitutional: Negative.   Respiratory: Negative.   Cardiovascular: Negative.   Gastrointestinal: Negative.   Musculoskeletal: Positive for arthralgias.  Neurological: Negative.     Objective:  BP 135/77 (BP Location: Left Arm)   Pulse (!) 101   Temp 98.4 F (36.9 C) (Oral)   Ht 4' 9.5" (1.461 m)   Wt 205 lb (93 kg)   LMP 04/06/2016 (Approximate)   BMI 43.59 kg/m   Physical Exam  Constitutional: She appears well-developed and well-nourished.  HENT:  Head: Normocephalic.  Cardiovascular: Normal rate and regular rhythm.   No murmur heard. Pulmonary/Chest: Effort normal and breath sounds normal.  Musculoskeletal: She exhibits no edema or tenderness (at the right  lateral malleolus).    Assessment & Plan:   Donna Daniel was seen today for work comp follow up.  Diagnoses and all orders for this visit:  Sprain of right ankle, unspecified ligament, subsequent encounter   I have discontinued Donna Daniel's Ankle Stirrup Brace/Right. I am also having her maintain her hydrochlorothiazide, albuterol, traMADol, and diclofenac.  No orders of the defined types were placed in this encounter.    You can go to your regular job all day.  Follow-up: Return if symptoms worsen or fail to improve.  Mechele ClaudeWarren Momoko Slezak, M.D.

## 2017-06-14 IMAGING — DX DG FOOT COMPLETE 3+V*R*
3 series · 3 of 3 positions shown · non-contrast
Comparison: None.

CLINICAL DATA: Twisting injury to foot with pain, initial encounter

EXAM:
RIGHT FOOT COMPLETE - 3+ VIEW

[foot ap]
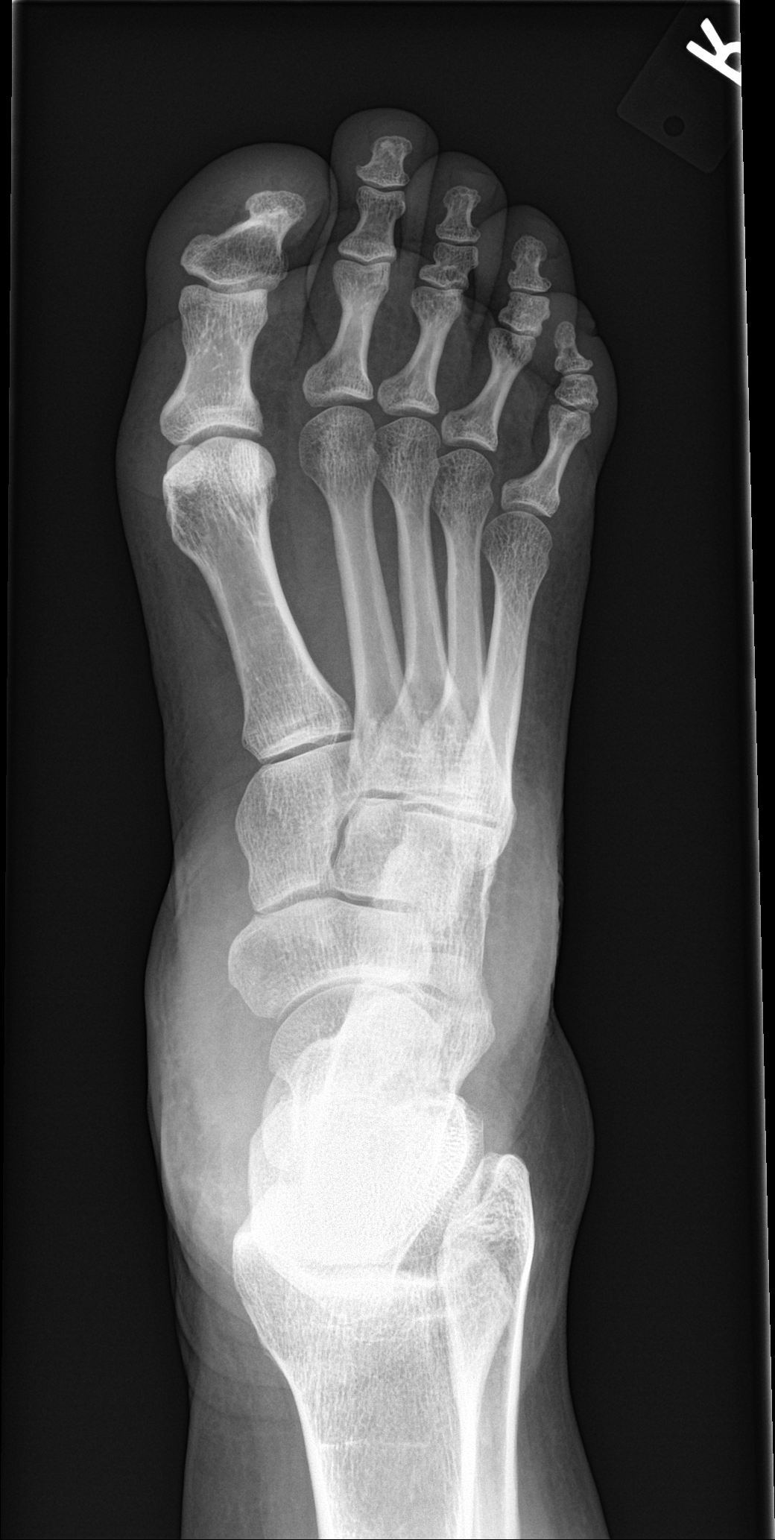

[foot obl]
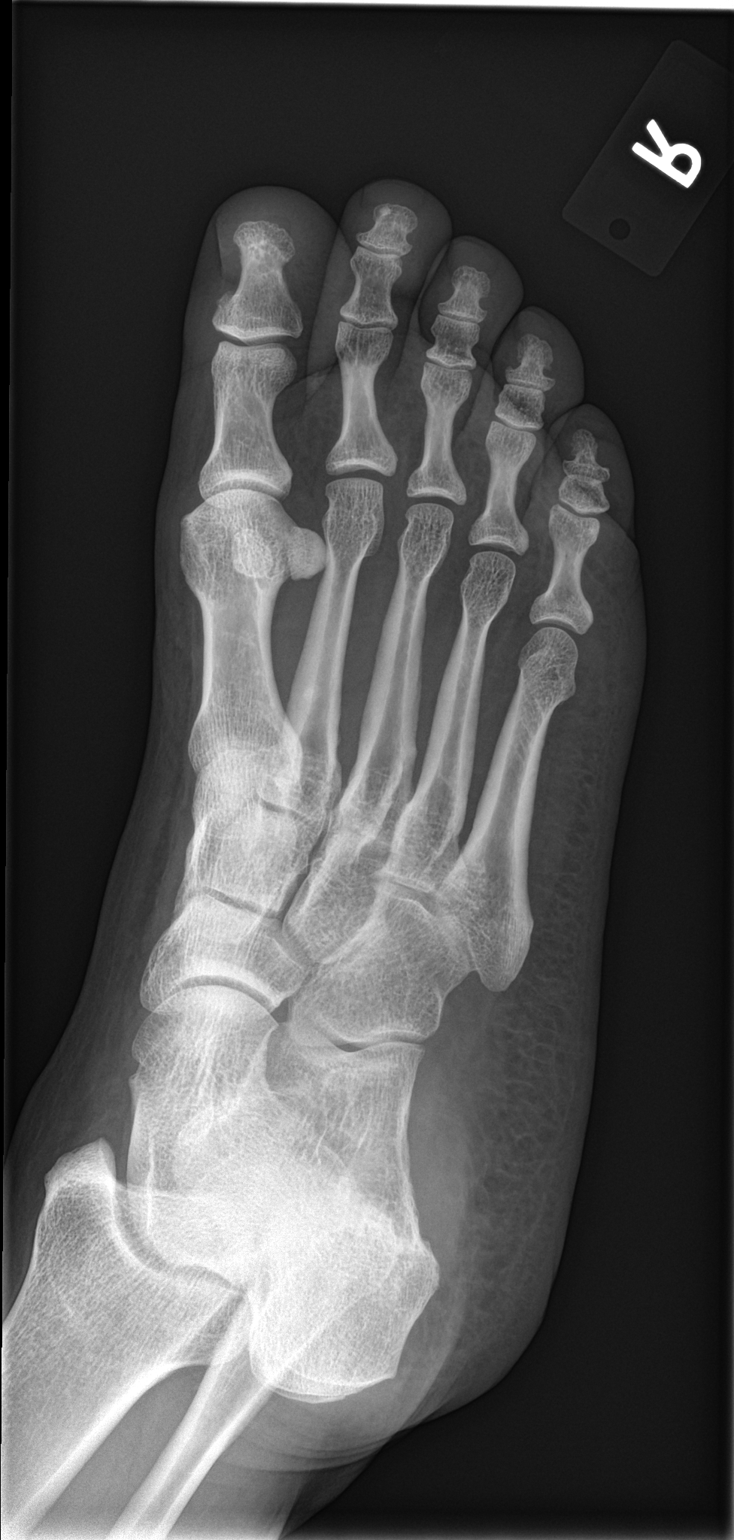

[foot lat]
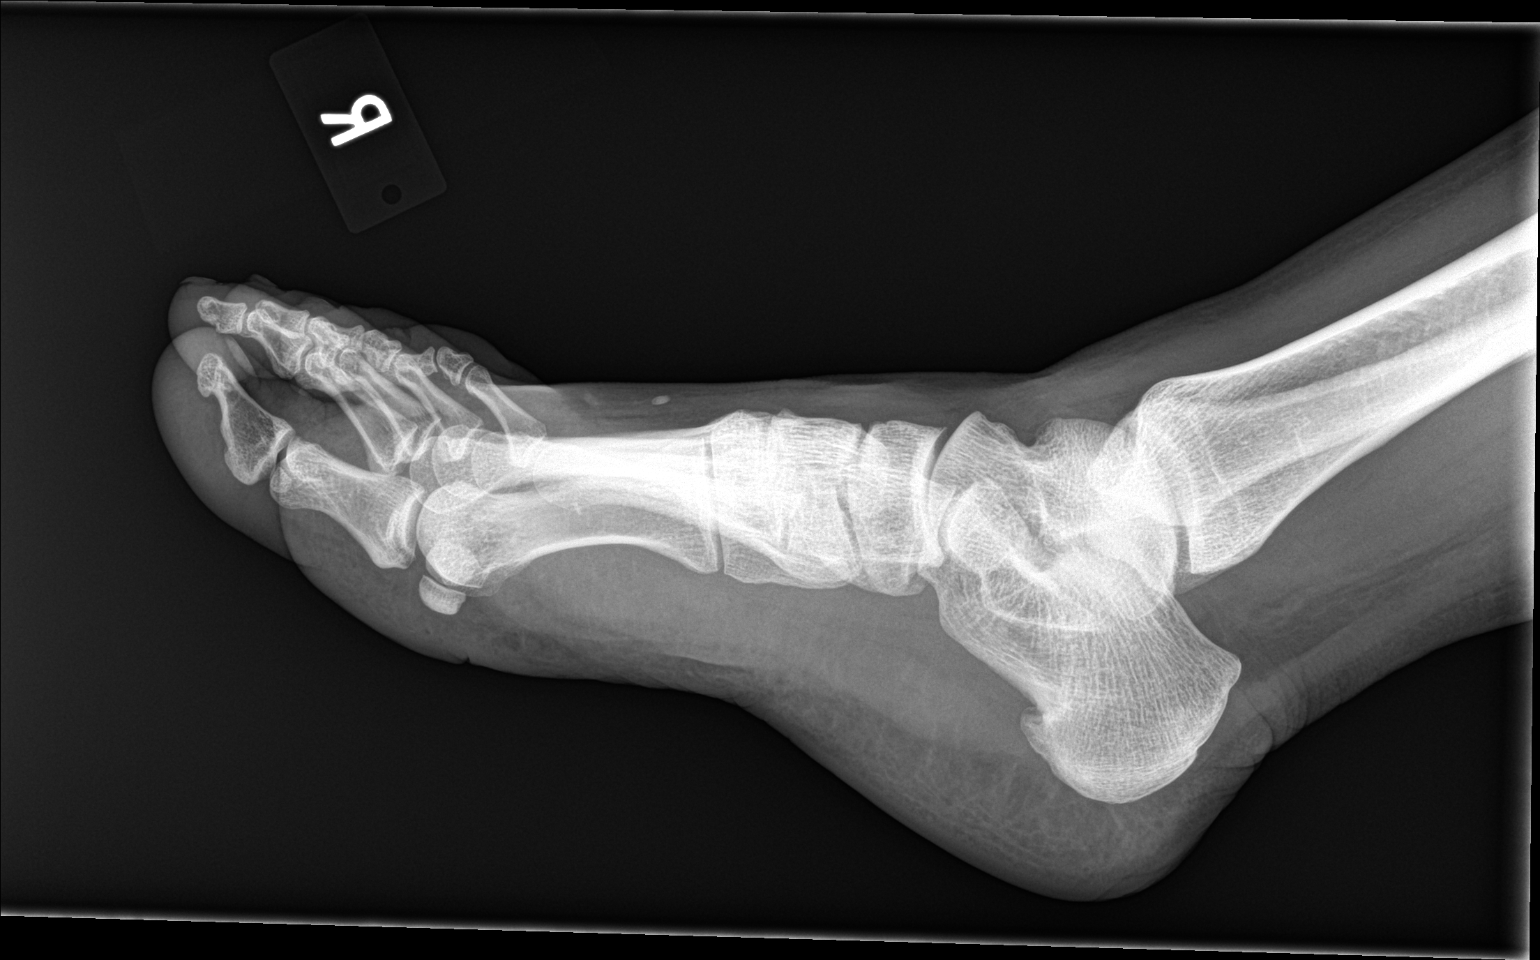

[3 of 3 positions shown; findings below may reference images not displayed]

FINDINGS: Considerable soft tissue swelling is noted about the ankle and
hindfoot no acute fracture or dislocation is seen. Small plantar
spur is noted.
IMPRESSION: Soft tissue swelling without acute bony abnormality.
